# Patient Record
Sex: Female | Born: 1995 | Race: White | Hispanic: No | Marital: Single | Smoking: Never smoker
Health system: Southern US, Community
[De-identification: ages and names within clinical notes are randomized; demographics above are authoritative.]

## PROBLEM LIST (undated history)

## (undated) DIAGNOSIS — F419 Anxiety disorder, unspecified: Secondary | ICD-10-CM

## (undated) DIAGNOSIS — E039 Hypothyroidism, unspecified: Secondary | ICD-10-CM

## (undated) DIAGNOSIS — E063 Autoimmune thyroiditis: Secondary | ICD-10-CM

---
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
## 2017-01-31 ENCOUNTER — Other Ambulatory Visit: Payer: Self-pay | Admitting: Family Medicine

## 2017-01-31 DIAGNOSIS — N6310 Unspecified lump in the right breast, unspecified quadrant: Secondary | ICD-10-CM

## 2017-02-06 ENCOUNTER — Other Ambulatory Visit: Payer: BC Managed Care – PPO

## 2017-02-14 ENCOUNTER — Ambulatory Visit
Admission: RE | Admit: 2017-02-14 | Discharge: 2017-02-14 | Disposition: A | Discharge status: Home / Self Care | Payer: BC Managed Care – PPO | Source: Ambulatory Visit | Attending: Family Medicine | Admitting: Family Medicine

## 2017-02-14 DIAGNOSIS — N6310 Unspecified lump in the right breast, unspecified quadrant: Secondary | ICD-10-CM

## 2018-02-23 IMAGING — CR DG CHEST 2V
2 series · 2 of 2 positions shown · non-contrast
Comparison: 07/17/2013

CLINICAL DATA: Lower chest pain since [DATE] a.m.. Difficulty taking
deep breaths. Vomiting. Nonsmoker.

EXAM:
CHEST  2 VIEW

[w chest pa]
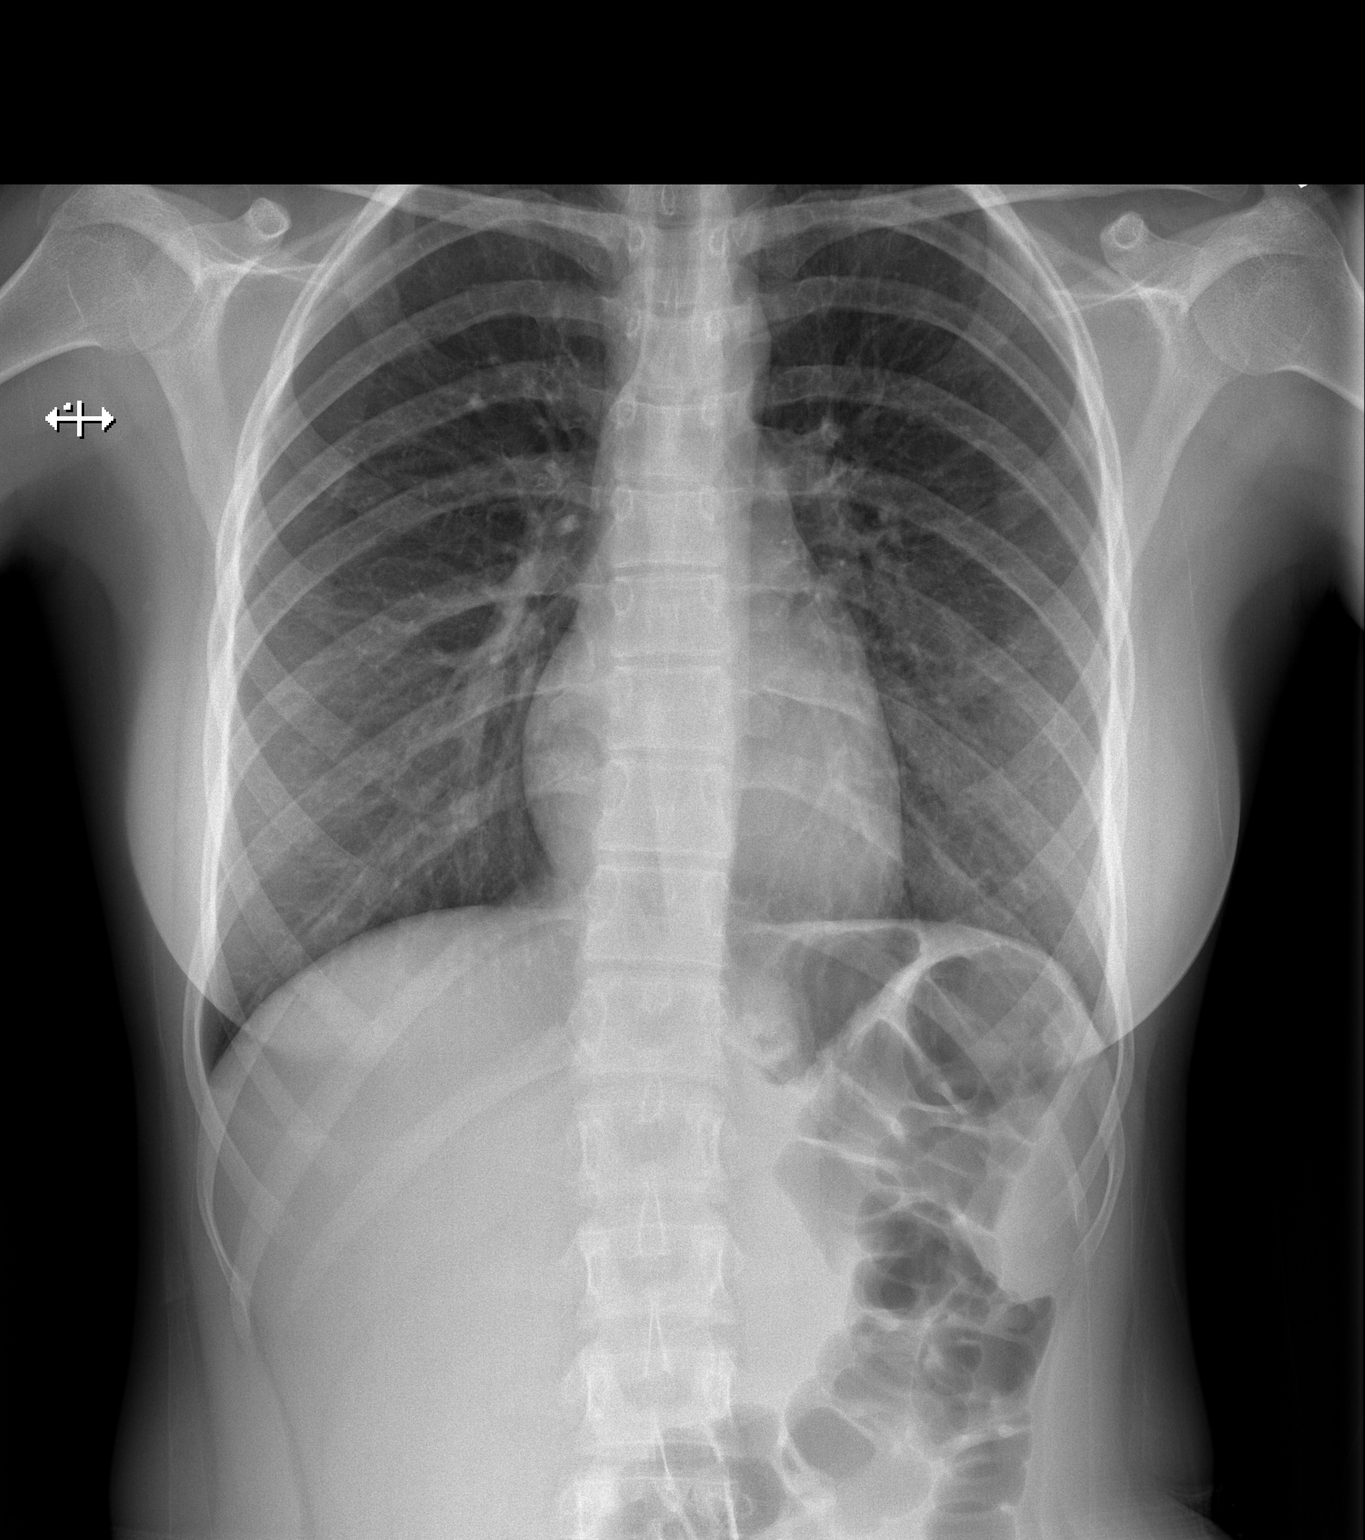

[w chest lat]
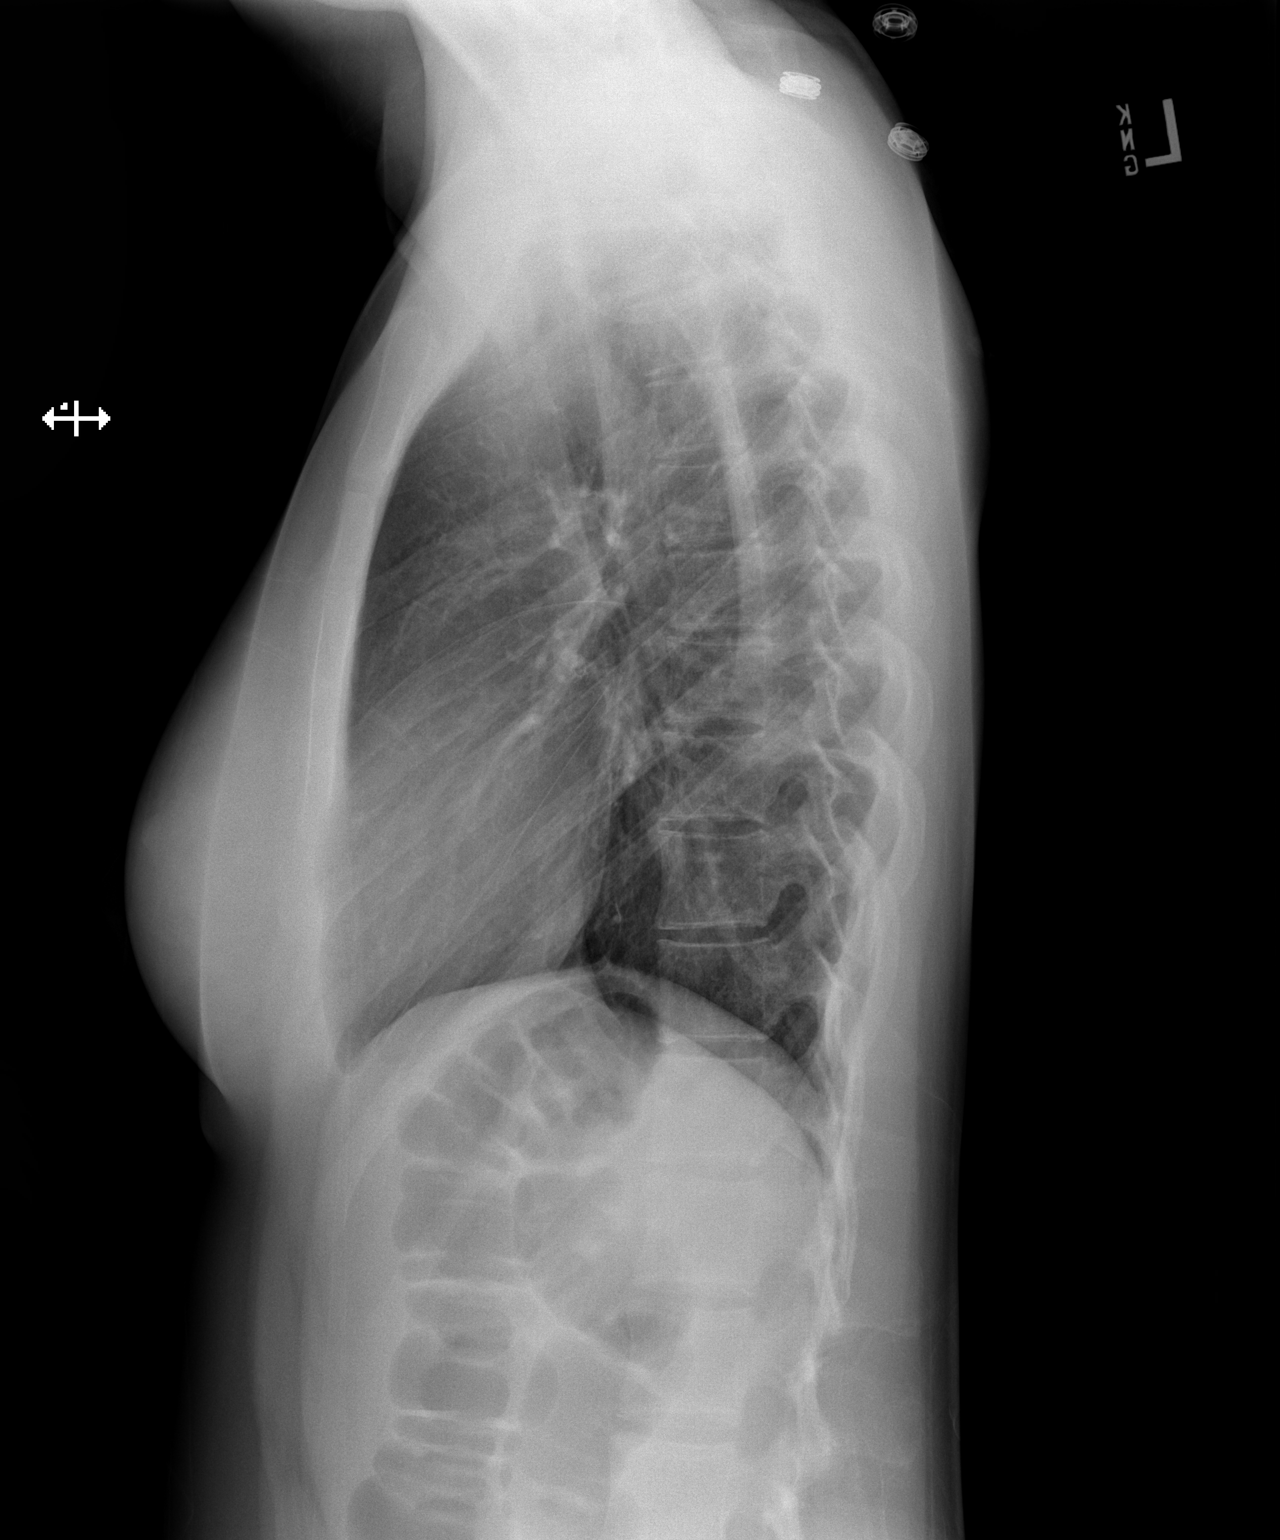

[2 of 2 positions shown; findings below may reference images not displayed]

FINDINGS: The heart size and mediastinal contours are within normal limits.
Both lungs are clear. The visualized skeletal structures are
unremarkable.
IMPRESSION: No active cardiopulmonary disease.

## 2022-11-09 ENCOUNTER — Other Ambulatory Visit: Payer: Self-pay

## 2022-11-09 ENCOUNTER — Emergency Department (HOSPITAL_BASED_OUTPATIENT_CLINIC_OR_DEPARTMENT_OTHER)
Admission: EM | Admit: 2022-11-09 | Discharge: 2022-11-09 | Disposition: A | Discharge status: Home / Self Care | Payer: BC Managed Care – PPO | Attending: Emergency Medicine | Admitting: Emergency Medicine

## 2022-11-09 ENCOUNTER — Encounter (HOSPITAL_BASED_OUTPATIENT_CLINIC_OR_DEPARTMENT_OTHER): Payer: Self-pay | Admitting: Emergency Medicine

## 2022-11-09 ENCOUNTER — Emergency Department (HOSPITAL_BASED_OUTPATIENT_CLINIC_OR_DEPARTMENT_OTHER): Payer: BC Managed Care – PPO

## 2022-11-09 DIAGNOSIS — W228XXA Striking against or struck by other objects, initial encounter: Secondary | ICD-10-CM | POA: Insufficient documentation

## 2022-11-09 DIAGNOSIS — Z91010 Allergy to peanuts: Secondary | ICD-10-CM | POA: Diagnosis not present

## 2022-11-09 DIAGNOSIS — S060XAA Concussion with loss of consciousness status unknown, initial encounter: Secondary | ICD-10-CM | POA: Insufficient documentation

## 2022-11-09 DIAGNOSIS — S0990XA Unspecified injury of head, initial encounter: Secondary | ICD-10-CM | POA: Diagnosis present

## 2022-11-09 LAB — PREGNANCY, URINE: Preg Test, Ur: NEGATIVE

## 2022-11-09 NOTE — ED Notes (Signed)
..  The patient is A&OX4, ambulatory at d/c with independent steady gait, NAD. Pt verbalized understanding of d/c instructions and follow up care.

## 2022-11-09 NOTE — ED Provider Triage Note (Signed)
Emergency Medicine Provider Triage Evaluation Note  Katherine Mcdaniel , a 27 y.o. female  was evaluated in triage.  Pt complains of hitting her head.  Pt complains of nausea, feeling foggy and nauseated  Review of Systems  Positive: Pain to head  Negative: LOC   Physical Exam  BP 121/79 (BP Location: Right Arm)   Pulse (!) 52   Temp 98.4 F (36.9 C)   Resp 16   Ht 5\' 2"  (1.575 m)   Wt 59.9 kg   SpO2 98%   BMI 24.14 kg/m  Gen:   Awake, no distress   Resp:  Normal effort  MSK:   Moves extremities without difficulty  Other:    Medical Decision Making  Medically screening exam initiated at 2:42 PM.  Appropriate orders placed.  Katherine Mcdaniel was informed that the remainder of the evaluation will be completed by another provider, this initial triage assessment does not replace that evaluation, and the importance of remaining in the ED until their evaluation is complete.     Elson Areas, [place] 11/09/22 1443

## 2022-11-09 NOTE — Discharge Instructions (Signed)
Return if any problems. Tylenol for discomfort

## 2022-11-09 NOTE — ED Triage Notes (Signed)
Was going to lie down on bed, and flopped down and hit back head on the wall. Presents with photosensitivity, nauseated, headache,foggy brained . Was seen at [facility] and rec CT.

## 2022-11-16 NOTE — ED Provider Notes (Signed)
[place] EMERGENCY DEPARTMENT AT [facility] Provider Note   CSN: [id number] Arrival date & time: 11/09/22  1415     History  No chief complaint on file.   Katherine Mcdaniel is a 27 y.o. female.  Patient reports that she leaned backwards and hit her head on a bed.  Patient reports since the time of the impact she has had a headache dizziness and some blurred vision.  Patient was seen at urgent care and was advised to come to the emergency department for possible CT scan.  There is concern that patient has a concussion.        Home Medications Prior to Admission medications   Medication Sig Start Date End Date Taking? Authorizing Provider  levothyroxine (SYNTHROID, LEVOTHROID) 50 MCG tablet Take 50 mcg by mouth daily. 09/10/15   [provider]  methocarbamol (ROBAXIN) 500 MG tablet Take 1 or 2 po Q 6hrs for muscle soreness 10/05/15   Devoria Albe, MD  naproxen (NAPROSYN) 250 MG tablet Take 1 po BID with food prn pain 10/05/15   Devoria Albe, MD      Allergies    Peanuts [peanut oil], Shellfish allergy, and Wound dressing adhesive    Review of Systems   Review of Systems  All other systems reviewed and are negative.   Physical Exam Updated Vital Signs BP 116/78   Pulse 86   Temp 98.3 F (36.8 C) (Oral)   Resp 18   Ht 5\' 2"  (1.575 m)   Wt 59.9 kg   SpO2 100%   BMI 24.14 kg/m  Physical Exam Vitals and nursing note reviewed.  Constitutional:      Appearance: She is well-developed.  HENT:     Head: Normocephalic.     Right Ear: External ear normal.     Left Ear: External ear normal.     Mouth/Throat:     Mouth: Mucous membranes are moist.  Cardiovascular:     Rate and Rhythm: Normal rate.  Pulmonary:     Effort: Pulmonary effort is normal.  Abdominal:     General: There is no distension.  Musculoskeletal:        General: Normal range of motion.     Cervical back: Normal range of motion.  Skin:    General: Skin is warm.  Neurological:      General: No focal deficit present.     Mental Status: She is alert and oriented to person, place, and time.  Psychiatric:        Mood and Affect: Mood normal.     ED Results / Procedures / Treatments   Labs (all labs ordered are listed, but only abnormal results are displayed) Labs Reviewed  PREGNANCY, URINE    EKG None  Radiology No results found.  Procedures Procedures    Medications Ordered in ED Medications - No data to display  ED Course/ Medical Decision Making/ A&P                                 Medical Decision Making Patient complains of a headache after hitting her head on a bed.  Patient reports that she has felt bad since that time of the injury.  Amount and/or Complexity of Data Reviewed Labs: ordered.    Details: And interpreted pregnancy test is negative Radiology: ordered.    Details: CT head shows no acute abnormality.  Risk Risk Details: And postconcussive symptoms.  Patient  is advised to return if any problems.  Follow-up with primary care for recheck           Final Clinical Impression(s) / ED Diagnoses Final diagnoses:  Concussion with unknown loss of consciousness status, initial encounter    Rx / DC Orders ED Discharge Orders     None      An After Visit Summary was printed and given to the patient.    Elson Areas, PA-C 11/16/22 1804    Rexford Maus, DO 11/16/22 2006

## 2023-03-29 ENCOUNTER — Other Ambulatory Visit: Payer: Self-pay | Admitting: Urology

## 2023-03-29 DIAGNOSIS — M25531 Pain in right wrist: Secondary | ICD-10-CM

## 2023-03-31 ENCOUNTER — Ambulatory Visit
Admission: RE | Admit: 2023-03-31 | Discharge: 2023-03-31 | Disposition: A | Discharge status: Home / Self Care | Payer: Self-pay | Source: Ambulatory Visit | Attending: Urology | Admitting: Urology

## 2023-03-31 DIAGNOSIS — M25531 Pain in right wrist: Secondary | ICD-10-CM

## 2023-08-21 DIAGNOSIS — Z03818 Encounter for observation for suspected exposure to other biological agents ruled out: Secondary | ICD-10-CM | POA: Diagnosis not present

## 2023-08-21 DIAGNOSIS — R5383 Other fatigue: Secondary | ICD-10-CM | POA: Diagnosis not present

## 2023-08-21 DIAGNOSIS — R059 Cough, unspecified: Secondary | ICD-10-CM | POA: Diagnosis not present

## 2023-08-21 DIAGNOSIS — R051 Acute cough: Secondary | ICD-10-CM | POA: Diagnosis not present

## 2023-08-21 DIAGNOSIS — R509 Fever, unspecified: Secondary | ICD-10-CM | POA: Diagnosis not present

## 2023-08-21 DIAGNOSIS — R52 Pain, unspecified: Secondary | ICD-10-CM | POA: Diagnosis not present

## 2024-01-17 DIAGNOSIS — Z111 Encounter for screening for respiratory tuberculosis: Secondary | ICD-10-CM | POA: Diagnosis not present

## 2024-01-17 DIAGNOSIS — Z0000 Encounter for general adult medical examination without abnormal findings: Secondary | ICD-10-CM | POA: Diagnosis not present
